# Patient Record
Sex: Female | Born: 1990 | Race: Black or African American | Hispanic: No | Marital: Single | Smoking: Never smoker
Health system: Southern US, Community
[De-identification: ages and names within clinical notes are randomized; demographics above are authoritative.]

---

## 2017-06-05 ENCOUNTER — Ambulatory Visit (HOSPITAL_COMMUNITY)
Admission: EM | Admit: 2017-06-05 | Discharge: 2017-06-05 | Disposition: A | Discharge status: Other Acute Inpatient Hospital | Payer: Self-pay

## 2017-06-05 ENCOUNTER — Encounter (HOSPITAL_COMMUNITY): Payer: Self-pay | Admitting: Family Medicine

## 2017-06-05 ENCOUNTER — Other Ambulatory Visit: Payer: Self-pay

## 2017-06-05 ENCOUNTER — Emergency Department (HOSPITAL_COMMUNITY)
Admission: EM | Admit: 2017-06-05 | Discharge: 2017-06-06 | Disposition: A | Discharge status: Home / Self Care | Payer: PRIVATE HEALTH INSURANCE | Attending: Emergency Medicine | Admitting: Emergency Medicine

## 2017-06-05 ENCOUNTER — Encounter (HOSPITAL_COMMUNITY): Payer: Self-pay | Admitting: *Deleted

## 2017-06-05 DIAGNOSIS — K0520 Aggressive periodontitis, unspecified: Secondary | ICD-10-CM | POA: Diagnosis not present

## 2017-06-05 DIAGNOSIS — R6884 Jaw pain: Secondary | ICD-10-CM | POA: Diagnosis present

## 2017-06-05 NOTE — ED Triage Notes (Signed)
Pt reports right sided jaw pain and pain in her right gums for 1 day with some possible swelling on that side. Denies fevers. No meds PTA.

## 2017-06-05 NOTE — ED Triage Notes (Signed)
Pt here for bilateral jaw ans gum pain since yesterday. Denies dental caries or sores in the mouth.

## 2017-06-06 MED ORDER — PENICILLIN V POTASSIUM 250 MG PO TABS
500.0000 mg | ORAL_TABLET | Freq: Once | ORAL | Status: AC
  Administered 2017-06-06: 500 mg via ORAL
  Filled 2017-06-06: qty 2

## 2017-06-06 MED ORDER — CHLORHEXIDINE GLUCONATE 0.12 % MT SOLN: 15.0000 mL | Freq: Two times a day (BID) | OROMUCOSAL | 0 refills | Status: AC

## 2017-06-06 MED ORDER — PENICILLIN V POTASSIUM 500 MG PO TABS: 500.0000 mg | ORAL_TABLET | Freq: Four times a day (QID) | ORAL | 0 refills | Status: AC

## 2017-06-06 NOTE — ED Provider Notes (Signed)
[facility] EMERGENCY DEPARTMENT Provider Note   CSN: [id number] Arrival date & time: 06/05/17  2110     History   Chief Complaint Chief Complaint  Patient presents with  . Jaw Pain    HPI Amber Gregory is a 27 y.o. female with no significant past medical history presents today for evaluation of right mandibular dental pain for 2 days.  She states that symptoms began yesterday and have been progressively worsening with radiation of the pain down the right side of the jaw.  Pain is constant throbbing in nature.  No aggravating or alleviating factors noted.  The patient notes some swelling and irritation to the gingiva posteriorly.  She states that her wisdom teeth have been coming in on the left side but she is unsure if her wisdom teeth have been coming in on the right.  She does note that she was eating crab legs yesterday and thinks that she may have injured her gums somehow.  She denies fevers, throat tightness, drooling, sore throat.  Denies significant facial swelling.She has tried warm water salt gargles and gargling with oil without relief of her symptoms.    The history is provided by the patient.    History reviewed. No pertinent past medical history.  There are no active problems to display for this patient.   History reviewed. No pertinent surgical history.   OB History   None      Home Medications    Prior to Admission medications   Medication Sig Start Date End Date Taking? Authorizing Provider  chlorhexidine (PERIDEX) 0.12 % solution Use as directed 15 mLs in the mouth or throat 2 (two) times daily. 06/06/17   Math Brazie A, PA-C  penicillin v potassium (VEETID) 500 MG tablet Take 1 tablet (500 mg total) by mouth 4 (four) times daily for 7 days. 06/06/17 06/13/17  Jeanie Sewer, PA-C    Family History No family history on file.  Social History Social History   Tobacco Use  . Smoking status: Never Smoker  . Smokeless tobacco: Never Used    Substance Use Topics  . Alcohol use: Never    Frequency: Never  . Drug use: Never     Allergies   Patient has no known allergies.   Review of Systems Review of Systems  Constitutional: Negative for chills and fever.  HENT: Positive for dental problem. Negative for drooling, facial swelling, sore throat and trouble swallowing.      Physical Exam Updated Vital Signs BP 122/71 (BP Location: Right Arm)   Pulse 81   Temp 97.7 F (36.5 C) (Oral)   Resp 19   LMP 06/02/2017   SpO2 100%   Physical Exam  Constitutional: She appears well-developed and well-nourished. No distress.  HENT:  Head: Normocephalic and atraumatic.  Posterior oropharynx is unremarkable.  There is gingival irritation and hypertrophy posteriorly to the right mandibular region posterior to the right second molar.  There is tenderness to palpation overlying this area but no observable abscess.  No drainage noted.  There is a small dental carry noted to the right second mandibular molar. Dentition appears to be stable. No noted area of swelling or fluctuance. No trismus. Mouth opening to at least 3 finger widths. Handles oral secretions without difficulty. No noted facial swelling. No swelling or tenderness to the submental or submandibular regions. No swelling or tenderness into the soft tissues of the neck.    Eyes: Conjunctivae are normal. Right eye exhibits no discharge. Left  eye exhibits no discharge.  Neck: Normal range of motion and full passive range of motion without pain. Neck supple. No JVD present. No tracheal deviation present.  Cardiovascular: Normal rate.  Pulmonary/Chest: Effort normal.  Abdominal: She exhibits no distension.  Musculoskeletal: She exhibits no edema.  Lymphadenopathy:    She has no cervical adenopathy.  Neurological: She is alert.  Skin: No erythema.  Psychiatric: She has a normal mood and affect. Her behavior is normal.  Nursing note and vitals reviewed.    ED  Treatments / Results  Labs (all labs ordered are listed, but only abnormal results are displayed) Labs Reviewed - No data to display  EKG None  Radiology No results found.  Procedures Procedures (including critical care time)  Medications Ordered in ED Medications  penicillin v potassium (VEETID) tablet 500 mg (500 mg Oral Given 06/06/17 0018)     Initial Impression / Assessment and Plan / ED Course  I have reviewed the triage vital signs and the nursing notes.  Pertinent labs & imaging results that were available during my care of the patient were reviewed by me and considered in my medical decision making (see chart for details).     Patient with gingival irritation.  Examination consistent with pericoronitis.  Patient is afebrile, vital signs are stable.  She is tolerating secretions without difficulty and is nontoxic in appearance.  No gross abscess.  Exam unconcerning for Ludwig's angina or spread of infection.  Will treat with penicillin and Peridex mouthwash.  Urged patient to follow-up with a dentist.  She does have one at home.  Discussed strict ED return precautions. Pt verbalized understanding of and agreement with plan and is safe for discharge home at this time.    Final Clinical Impressions(s) / ED Diagnoses   Final diagnoses:  Acute pericoronitis    ED Discharge Orders        Ordered    penicillin v potassium (VEETID) 500 MG tablet  4 times daily     06/06/17 0013    chlorhexidine (PERIDEX) 0.12 % solution  2 times daily     06/06/17 0013       Jeanie Sewer, PA-C 06/06/17 0134    Doug Sou, MD 06/07/17 [phone]

## 2017-06-06 NOTE — Discharge Instructions (Addendum)
Please take all of your antibiotics until finished!   You may develop abdominal discomfort or diarrhea from the antibiotic.  You may help offset this with probiotics which you can buy or get in yogurt. Do not eat  or take the probiotics until 2 hours after your antibiotic.  ° °Apply warm compresses to jaw throughout the day. Alternate 600 mg of ibuprofen and 500-1000 mg of Tylenol every 3 hours as needed for pain. Do not exceed 4000 mg of Tylenol daily.  You may also use warm water salt gargles, Orajel, or other over-the-counter dental pain remedies. Use Peridex mouthwash twice daily. Followup with a dentist is very important for ongoing evaluation and management of recurrent dental pain. Return to emergency department for emergent changing or worsening symptoms such as fever, worsening facial swelling, difficulty breathing or swallowing, throat tightness, or vision changes. ° °

## 2017-10-08 ENCOUNTER — Encounter: Payer: Self-pay | Admitting: Emergency Medicine

## 2017-10-08 ENCOUNTER — Emergency Department (HOSPITAL_COMMUNITY): Payer: No Typology Code available for payment source

## 2017-10-08 ENCOUNTER — Ambulatory Visit (HOSPITAL_COMMUNITY)
Admission: EM | Admit: 2017-10-08 | Discharge: 2017-10-08 | Discharge status: Against Medical Advice | Payer: No Typology Code available for payment source | Source: Home / Self Care

## 2017-10-08 ENCOUNTER — Emergency Department (HOSPITAL_COMMUNITY)
Admission: EM | Admit: 2017-10-08 | Discharge: 2017-10-08 | Disposition: A | Discharge status: Home / Self Care | Payer: No Typology Code available for payment source | Attending: Emergency Medicine | Admitting: Emergency Medicine

## 2017-10-08 DIAGNOSIS — R05 Cough: Secondary | ICD-10-CM | POA: Diagnosis present

## 2017-10-08 DIAGNOSIS — J069 Acute upper respiratory infection, unspecified: Secondary | ICD-10-CM | POA: Insufficient documentation

## 2017-10-08 LAB — GROUP A STREP BY PCR: GROUP A STREP BY PCR: NOT DETECTED

## 2017-10-08 MED ORDER — FLUTICASONE PROPIONATE 50 MCG/ACT NA SUSP: 1.0000 | Freq: Every day | NASAL | 2 refills | Status: AC

## 2017-10-08 MED ORDER — BENZONATATE 200 MG PO CAPS: 200.0000 mg | ORAL_CAPSULE | Freq: Three times a day (TID) | ORAL | 0 refills | Status: AC

## 2017-10-08 NOTE — ED Provider Notes (Signed)
[facility] EMERGENCY DEPARTMENT Provider Note   CSN: [id number] Arrival date & time: 10/08/17  1654     History   Chief Complaint Chief Complaint  Patient presents with  . Cough  . Sore Throat    HPI Amber Gregory is a 27 y.o. female.  27 year old female presents with complaint of sore throat, cough, sinus congestion x2 days.  Patient has been taking ginger tea at home for her symptoms.  Denies fevers, chills, body aches, sick contacts, recent travel.  Patient is a non-smoker, no history of asthma.  No other complaints or concerns.     History reviewed. No pertinent past medical history.  There are no active problems to display for this patient.   History reviewed. No pertinent surgical history.   OB History   None      Home Medications    Prior to Admission medications   Medication Sig Start Date End Date Taking? Authorizing Provider  benzonatate (TESSALON) 200 MG capsule Take 1 capsule (200 mg total) by mouth every 8 (eight) hours for 10 days. 10/08/17 10/18/17  Jeannie Fend, PA-C  chlorhexidine (PERIDEX) 0.12 % solution Use as directed 15 mLs in the mouth or throat 2 (two) times daily. 06/06/17   Fawze, Mina A, PA-C  fluticasone (FLONASE) 50 MCG/ACT nasal spray Place 1 spray into both nostrils daily. 10/08/17   Jeannie Fend, PA-C    Family History History reviewed. No pertinent family history.  Social History Social History   Tobacco Use  . Smoking status: Never Smoker  . Smokeless tobacco: Never Used  Substance Use Topics  . Alcohol use: Never    Frequency: Never  . Drug use: Never     Allergies   Patient has no known allergies.   Review of Systems Review of Systems  Constitutional: Negative for chills and fever.  HENT: Positive for congestion, postnasal drip, sneezing and sore throat. Negative for ear pain, rhinorrhea, sinus pressure, sinus pain, trouble swallowing and voice change.   Eyes: Negative for discharge and  redness.  Respiratory: Positive for cough. Negative for shortness of breath and wheezing.   Musculoskeletal: Negative for arthralgias and myalgias.  Skin: Negative for rash and wound.  Allergic/Immunologic: Negative for immunocompromised state.  Neurological: Negative for weakness and headaches.  Hematological: Negative for adenopathy.  All other systems reviewed and are negative.    Physical Exam Updated Vital Signs BP 120/73 (BP Location: Right Arm)   Pulse 77   Temp 98.9 F (37.2 C) (Oral)   Resp 18   LMP 09/11/2017   SpO2 100%   Physical Exam  Constitutional: She is oriented to person, place, and time. She appears well-developed and well-nourished. No distress.  HENT:  Head: Normocephalic and atraumatic.  Right Ear: Hearing, tympanic membrane and ear canal normal. No tenderness. No middle ear effusion.  Left Ear: Hearing, tympanic membrane and ear canal normal. No tenderness.  No middle ear effusion.  Mouth/Throat: Uvula is midline and mucous membranes are normal. Posterior oropharyngeal erythema present. No posterior oropharyngeal edema or tonsillar abscesses. No tonsillar exudate.  Neck: Neck supple.  Cardiovascular: Normal rate.  Pulmonary/Chest: Effort normal and breath sounds normal. She has no wheezes.  Lymphadenopathy:    She has no cervical adenopathy.  Neurological: She is alert and oriented to person, place, and time.  Skin: Skin is warm and dry. No rash noted. She is not diaphoretic.  Psychiatric: She has a normal mood and affect. Her behavior is normal.  Nursing  note and vitals reviewed.    ED Treatments / Results  Labs (all labs ordered are listed, but only abnormal results are displayed) Labs Reviewed  GROUP A STREP BY PCR    EKG None  Radiology Dg Chest 2 View  Result Date: 10/08/2017 CLINICAL DATA:  27 year old female with productive cough and congestion for 2 days. EXAM: CHEST - 2 VIEW COMPARISON:  None. FINDINGS: The heart size and  mediastinal contours are within normal limits. Both lungs are clear. The visualized skeletal structures are unremarkable. Negative visible bowel gas pattern. IMPRESSION: Negative. Electronically Signed   By: Odessa FlemingH  Hall M.D.   On: 10/08/2017 18:28    Procedures Procedures (including critical care time)  Medications Ordered in ED Medications - No data to display   Initial Impression / Assessment and Plan / ED Course  I have reviewed the triage vital signs and the nursing notes.  Pertinent labs & imaging results that were available during my care of the patient were reviewed by me and considered in my medical decision making (see chart for details).  Clinical Course as of Oct 08 1905  Mon Oct 08, 2017  [id number] 27 year old female with URI symptoms x2 days, on exam she has postnasal drip with mild posterior pharyngeal erythema.  Rapid strep test is negative, chest x-ray is clear.  Recommend supportive therapy as well as Tessalon for her cough.  Recheck with PCP, return to ER for worsening or concerning symptoms.   [LM]    Clinical Course User Index [LM] Jeannie FendMurphy, Anahli Arvanitis A, PA-C    Final Clinical Impressions(s) / ED Diagnoses   Final diagnoses:  Upper respiratory tract infection, unspecified type    ED Discharge Orders         Ordered    fluticasone (FLONASE) 50 MCG/ACT nasal spray  Daily     10/08/17 1901    benzonatate (TESSALON) 200 MG capsule  Every 8 hours     10/08/17 1901           Jeannie FendMurphy, Lejon Afzal A, PA-C 10/08/17 1907    Margarita Grizzleay, Danielle, MD 10/11/17 1049

## 2017-10-08 NOTE — Discharge Instructions (Addendum)
Take Zyrtec daily. Use saline sinus rinse twice daily.  Use Flonase daily. Take Sudafed as directed, asked the pharmacist for this medication. Take Tessalon as needed as prescribed for cough. Recheck with PCP if symptoms worsen or last longer than 2 to 3 weeks.

## 2017-10-08 NOTE — ED Triage Notes (Signed)
Pt to ER for evaluation of cough with yellow sputum production and sore throat. Reports x2 days.

## 2019-05-06 IMAGING — CR DG CHEST 2V
2 series · 2 of 2 positions shown · non-contrast
Comparison: None.

CLINICAL DATA: 26-year-old female with productive cough and
congestion for 2 days.

EXAM:
CHEST - 2 VIEW

[chest pa]
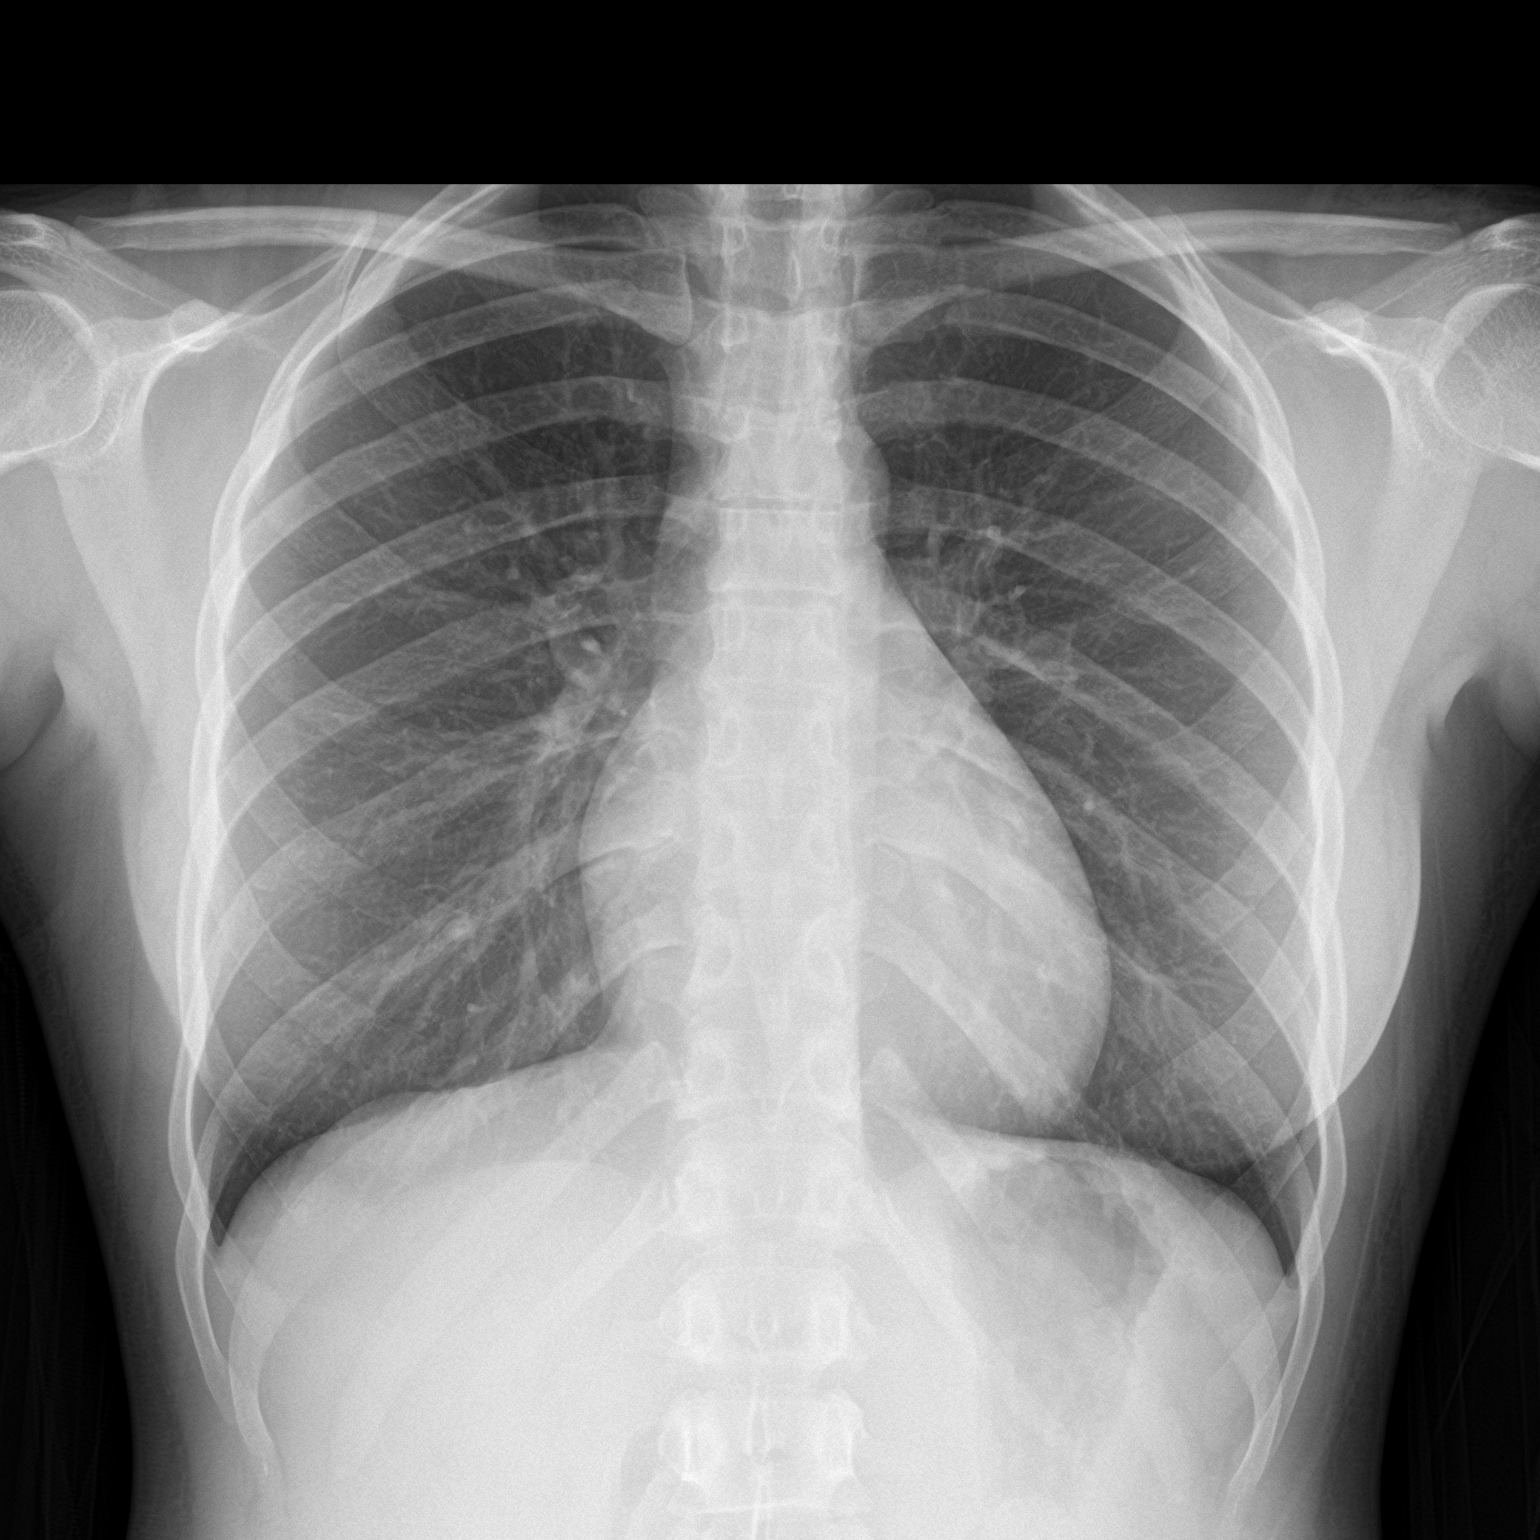

[chest lat]
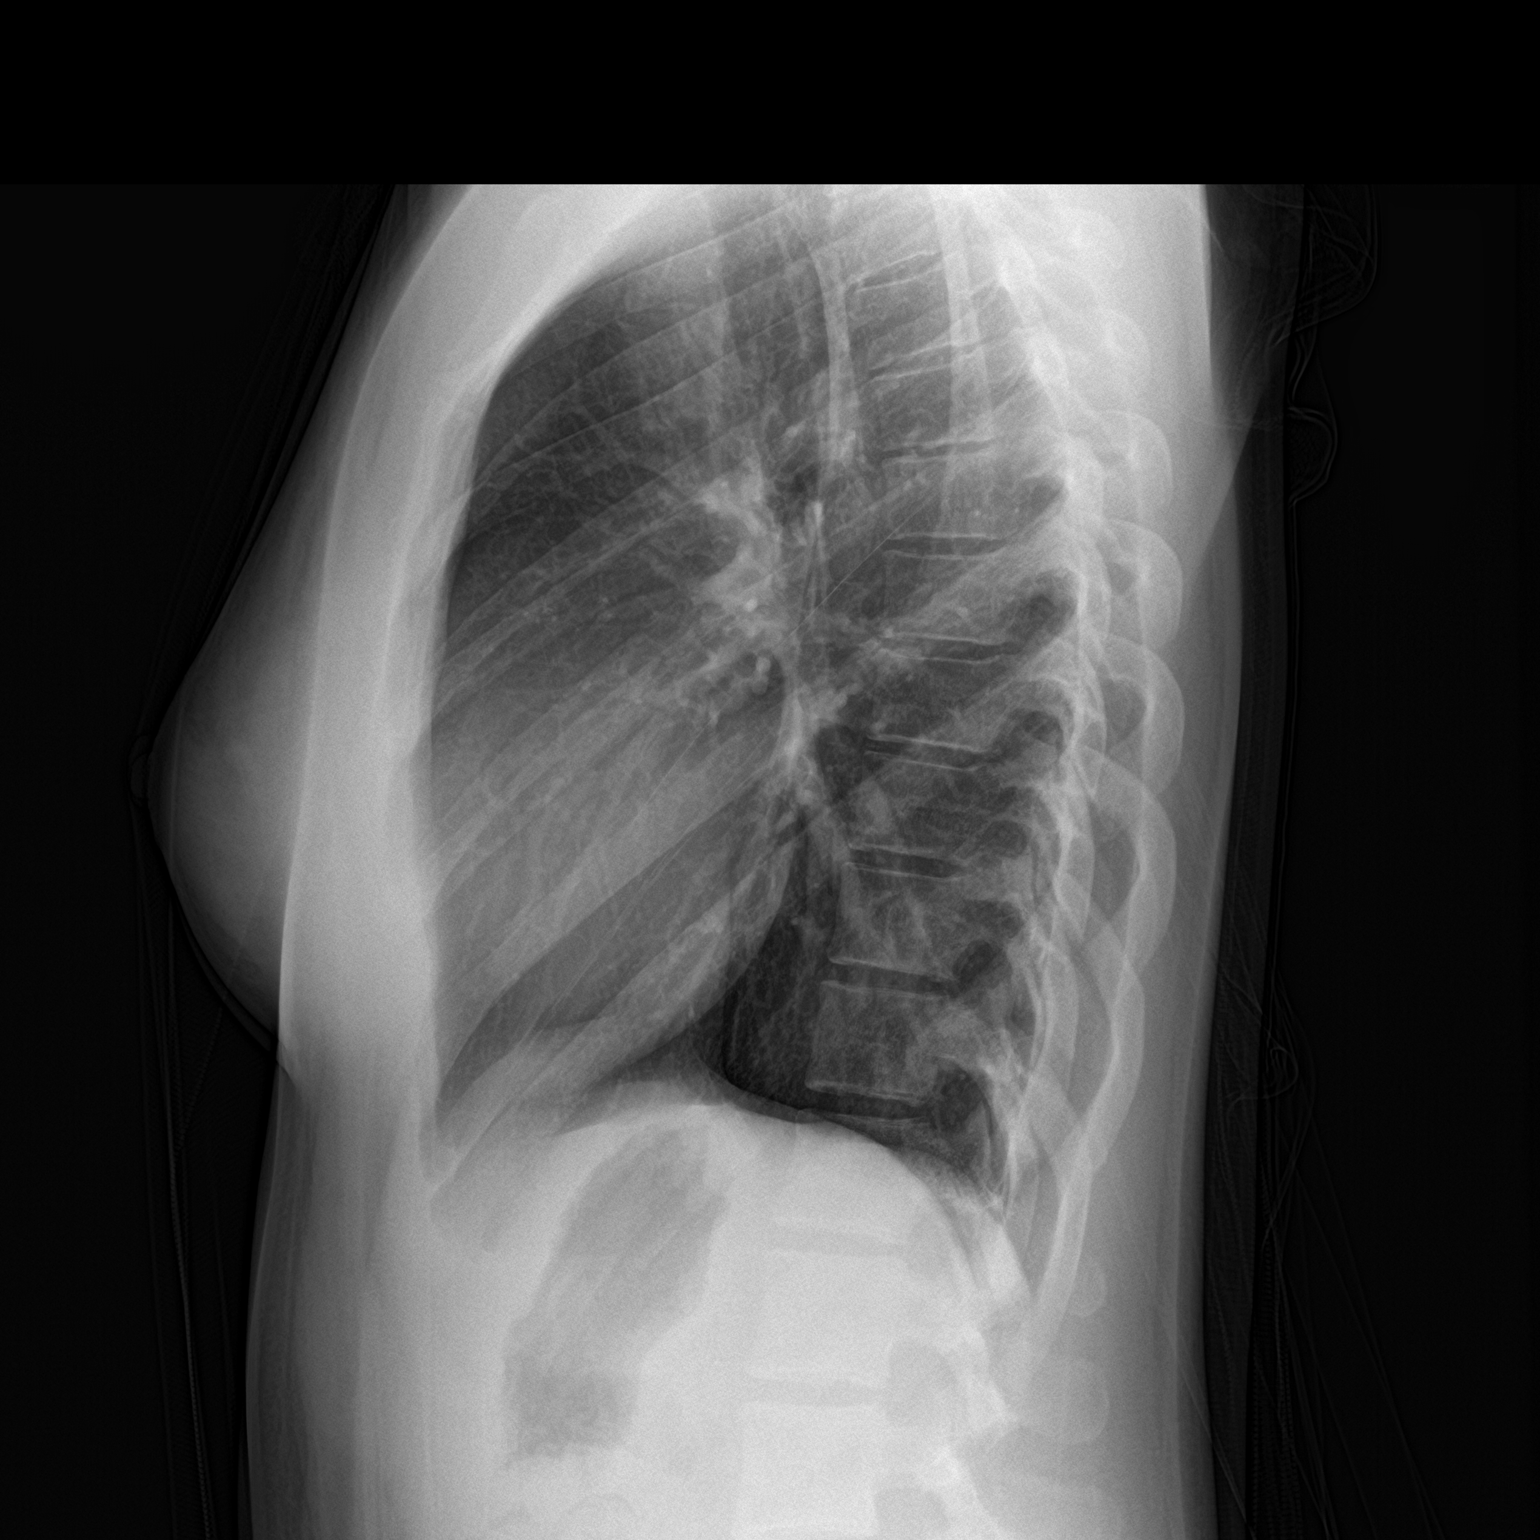

[2 of 2 positions shown; findings below may reference images not displayed]

FINDINGS: The heart size and mediastinal contours are within normal limits.
Both lungs are clear. The visualized skeletal structures are
unremarkable. Negative visible bowel gas pattern.
IMPRESSION: Negative.
